# Patient Record
Sex: Female | Born: 2000 | Race: White | Hispanic: No | Marital: Single | Smoking: Never smoker
Health system: Southern US, Community
[De-identification: ages and names within clinical notes are randomized; demographics above are authoritative.]

## PROBLEM LIST (undated history)

## (undated) DIAGNOSIS — F909 Attention-deficit hyperactivity disorder, unspecified type: Secondary | ICD-10-CM

## (undated) DIAGNOSIS — F319 Bipolar disorder, unspecified: Secondary | ICD-10-CM

## (undated) DIAGNOSIS — F329 Major depressive disorder, single episode, unspecified: Secondary | ICD-10-CM

## (undated) DIAGNOSIS — J45909 Unspecified asthma, uncomplicated: Secondary | ICD-10-CM

## (undated) DIAGNOSIS — F419 Anxiety disorder, unspecified: Secondary | ICD-10-CM

## (undated) DIAGNOSIS — F938 Other childhood emotional disorders: Secondary | ICD-10-CM

## (undated) DIAGNOSIS — G4700 Insomnia, unspecified: Secondary | ICD-10-CM

## (undated) DIAGNOSIS — F32A Depression, unspecified: Secondary | ICD-10-CM

## (undated) HISTORY — DX: Attention-deficit hyperactivity disorder, unspecified type: F90.9

---
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
## 2021-01-15 ENCOUNTER — Telehealth (INDEPENDENT_AMBULATORY_CARE_PROVIDER_SITE_OTHER): Payer: 59 | Admitting: Nurse Practitioner

## 2021-01-15 ENCOUNTER — Encounter: Payer: Self-pay | Admitting: Nurse Practitioner

## 2021-01-15 DIAGNOSIS — J0110 Acute frontal sinusitis, unspecified: Secondary | ICD-10-CM

## 2021-01-15 MED ORDER — AMOXICILLIN 500 MG PO CAPS: 500.0000 mg | ORAL_CAPSULE | Freq: Two times a day (BID) | ORAL | 0 refills | Status: AC

## 2021-01-15 MED ORDER — ALBUTEROL SULFATE HFA 108 (90 BASE) MCG/ACT IN AERS: 1.0000 | INHALATION_SPRAY | Freq: Four times a day (QID) | RESPIRATORY_TRACT | 11 refills | Status: DC | PRN

## 2021-01-15 NOTE — Progress Notes (Signed)
There were no vitals taken for this visit.   Subjective:    Patient ID: Heidi Greene, female    DOB: 09-05-2000, 20 y.o.   MRN: [id number]  HPI: Heidi Greene is a 20 y.o. female  Chief Complaint  Patient presents with  . Sore Throat  . Cough  . Ear Pain    B/L    UPPER RESPIRATORY TRACT INFECTION Worst symptom: sore throat, cough, ear pain Fever: no Cough: yes Shortness of breath: yes Wheezing: no Chest pain: no Chest tightness: no Chest congestion: yes Nasal congestion: yes Runny nose: yes Post nasal drip: yes Sneezing: yes Sore throat: yes Swollen glands: no Sinus pressure: yes Headache: yes Face pain: a little uncomfortable Toothache: no Ear pain: no bilateral Ear pressure: yes bilateral Eyes red/itching:no Eye drainage/crusting: no  Vomiting: no Rash: no Fatigue: yes Sick contacts: yes Strep contacts: no  Context: worse Recurrent sinusitis: no Relief with OTC cold/cough medications: no  Treatments attempted: dayquil/nightquil   Relevant past medical, surgical, family and social history reviewed and updated as indicated. Interim medical history since our last visit reviewed. Allergies and medications reviewed and updated.  Review of Systems  Constitutional: Positive for fatigue. Negative for fever.  HENT: Positive for congestion, ear pain, postnasal drip, rhinorrhea, sinus pressure, sinus pain, sneezing and sore throat. Negative for dental problem.   Respiratory: Positive for cough and shortness of breath. Negative for wheezing.   Cardiovascular: Negative for chest pain.  Gastrointestinal: Negative for vomiting.  Skin: Negative for rash.  Neurological: Negative for headaches.    Per HPI unless specifically indicated above     Objective:    There were no vitals taken for this visit.  Wt Readings from Last 3 Encounters:  10/14/19 145 lb (65.8 kg) (80 %, Z= 0.86)*  09/01/19 150 lb (68 kg) (85 %, Z= 1.02)*  07/22/19 149 lb (67.6 kg)  (84 %, Z= 1.00)*   * Growth percentiles are based on CDC (Girls, 2-20 Years) data.    Physical Exam Vitals and nursing note reviewed.  Pulmonary:     Effort: Pulmonary effort is normal. No respiratory distress.  Neurological:     Mental Status: She is alert.  Psychiatric:        Mood and Affect: Mood normal.        Behavior: Behavior normal.        Thought Content: Thought content normal.        Judgment: Judgment normal.     Results for orders placed or performed in visit on 09/21/18  WET PREP FOR TRICH, YEAST, CLUE   Specimen: Vaginal Fluid   VAGINAL FLUI  Result Value Ref Range   Trichomonas Exam Negative Negative   Yeast Exam Negative Negative   Clue Cell Exam Negative Negative      Assessment & Plan:   Problem List Items Addressed This Visit   None   Visit Diagnoses    Acute non-recurrent frontal sinusitis    -  Primary   Complete course of antibiotics. Use inhaler PRN for SOB. RTC if symptoms worsen or do not improve.    Relevant Medications   amoxicillin (AMOXIL) 500 MG capsule       Follow up plan: No follow-ups on file.    This visit was completed via MyChart due to the restrictions of the COVID-19 pandemic. All issues as above were discussed and addressed. Physical exam was done as above through visual confirmation on MyChart. If it was felt that the  patient should be evaluated in the office, they were directed there. The patient verbally consented to this visit. 1. Location of the patient: Home 2. Location of the provider: Office 3. Those involved with this call:  ? Provider: Larae Grooms, NP ? CMA: Ulyess Mort, CMA ? Front Desk/Registration: Harriet Pho 4. Time spent on call: 15 minutes with patient via phone conference. More than 50% of this time was spent in counseling and coordination of care. 20 minutes total spent in review of patient's record and preparation of their chart.

## 2021-03-19 ENCOUNTER — Telehealth: Payer: Self-pay

## 2021-03-19 NOTE — Telephone Encounter (Signed)
Copied from CRM [id number]. Topic: General - Other >> Mar 19, 2021 10:59 AM Gwenlyn Fudge wrote: Reason for CRM: Pt called stating that she is needing to check to see that her immunizations are up to date and also states that she will need a copy for school. Please advise.

## 2021-03-19 NOTE — Telephone Encounter (Signed)
Advised patient vaccine record is ready for pick up and vaccines are up to date. Patient will be picking up record today.

## 2022-08-01 ENCOUNTER — Ambulatory Visit
Admission: EM | Admit: 2022-08-01 | Discharge: 2022-08-01 | Disposition: A | Discharge status: Home / Self Care | Payer: Managed Care, Other (non HMO) | Attending: Urgent Care | Admitting: Urgent Care

## 2022-08-01 DIAGNOSIS — Z1152 Encounter for screening for COVID-19: Secondary | ICD-10-CM | POA: Diagnosis not present

## 2022-08-01 DIAGNOSIS — J029 Acute pharyngitis, unspecified: Secondary | ICD-10-CM | POA: Diagnosis present

## 2022-08-01 DIAGNOSIS — H938X9 Other specified disorders of ear, unspecified ear: Secondary | ICD-10-CM | POA: Diagnosis present

## 2022-08-01 DIAGNOSIS — R6889 Other general symptoms and signs: Secondary | ICD-10-CM

## 2022-08-01 DIAGNOSIS — R0602 Shortness of breath: Secondary | ICD-10-CM | POA: Diagnosis present

## 2022-08-01 DIAGNOSIS — J45901 Unspecified asthma with (acute) exacerbation: Secondary | ICD-10-CM | POA: Diagnosis not present

## 2022-08-01 DIAGNOSIS — J069 Acute upper respiratory infection, unspecified: Secondary | ICD-10-CM | POA: Diagnosis not present

## 2022-08-01 LAB — RESP PANEL BY RT-PCR (FLU A&B, COVID) ARPGX2
Influenza A by PCR: NEGATIVE
Influenza B by PCR: NEGATIVE
SARS Coronavirus 2 by RT PCR: NEGATIVE

## 2022-08-01 MED ORDER — PREDNISONE 20 MG PO TABS: 60.0000 mg | ORAL_TABLET | Freq: Every day | ORAL | 0 refills | Status: AC

## 2022-08-01 MED ORDER — ALBUTEROL SULFATE HFA 108 (90 BASE) MCG/ACT IN AERS: 1.0000 | INHALATION_SPRAY | Freq: Four times a day (QID) | RESPIRATORY_TRACT | 11 refills | Status: AC | PRN

## 2022-08-01 MED ORDER — DEXAMETHASONE SODIUM PHOSPHATE 10 MG/ML IJ SOLN
10.0000 mg | Freq: Once | INTRAMUSCULAR | Status: AC
  Administered 2022-08-01: 10 mg via INTRAMUSCULAR

## 2022-08-01 NOTE — Discharge Instructions (Addendum)
You are being treated today for an acute exacerbation of your asthma.  We have given you an injection of a steroid in clinic charging you with an oral steroid which she will take for the next 5 days.  Please take the oral steroid in the morning with food to avoid both sleep disturbance and irritation of your stomach.  Follow up here or with your primary care provider if your symptoms are worsening or not improving with treatment.

## 2022-08-01 NOTE — ED Triage Notes (Signed)
Pt. Presents to UC w/ c/o a sore throat and ear pressure that started 3 days ago. Pt. Also endorses SOB that started last night.

## 2022-08-01 NOTE — ED Provider Notes (Addendum)
Renaldo Fiddler    CSN: [id number] Arrival date & time: 08/01/22  1143      History   Chief Complaint Chief Complaint  Patient presents with   Sore Throat   Ear Fullness   Shortness of Breath    HPI Heidi Greene is a 21 y.o. female.    Sore Throat Associated symptoms include shortness of breath.  Ear Fullness Associated symptoms include shortness of breath.  Shortness of Breath   Urgent care with complaint of sore throat and ear pressure starting 3 days ago.  Patient also endorses shortness of breath starting last night.  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 Authorizing Provider  ADDERALL XR 10 MG 24 hr capsule Take 1 capsule by mouth every morning. Patient not  taking: Reported on 01/15/2021 04/08/19   [provider]  albuterol (PROAIR HFA) 108 (90 Base) MCG/ACT inhaler Inhale 1 puff into the lungs every 6 (six) hours as needed for wheezing or shortness of breath. 01/15/21   Larae Grooms, NP  FLOVENT HFA 44 MCG/ACT inhaler INL 2 PFS PO BID 04/09/18   Particia Nearing, PA-C  fluticasone [facility]) 50 MCG/ACT nasal spray Place 1 spray into both nostrils 2 (two) times daily. Patient not taking: Reported on 01/15/2021 05/06/18   Particia Nearing, PA-C  lamoTRIgine (LAMICTAL) 150 MG tablet  02/23/18   [provider]    Family History Family History  Problem Relation Age of Onset   Diabetes Maternal Grandfather    Hypertension Maternal Grandfather    Bipolar disorder Maternal Grandfather    Alcohol abuse Maternal Grandfather    Pancreatic cancer Maternal Grandfather 65       Deceased at 77    Social History Social History   Tobacco Use   Smoking status: Never   Smokeless tobacco: Never  Vaping Use   Vaping Use: Never used  Substance Use Topics   Alcohol use: No   Drug use:  No     Allergies   Patient has no known allergies.   Review of Systems Review of Systems  Respiratory:  Positive for shortness of breath.      Physical Exam Triage Vital Signs ED Triage Vitals  Enc Vitals Group     BP 08/01/22 1253 112/61     Pulse Rate 08/01/22 1253 (!) 120     Resp 08/01/22 1253 18     Temp 08/01/22 1253 99.9 F (37.7 C)     Temp src --      SpO2 08/01/22 1253 94 %     Weight --      Height --      Head Circumference --      Peak Flow --      Pain Score 08/01/22 1252 0     Pain Loc --      Pain Edu? --      Excl. in GC? --    No data found.  Updated Vital Signs BP 112/61   Pulse (!) 120   Temp 99.9 F (37.7 C)   Resp 18   SpO2 94%   Visual Acuity Right Eye Distance:   Left Eye Distance:   Bilateral Distance:    Right Eye Near:   Left Eye Near:    Bilateral Near:     Physical  Exam Vitals reviewed.  Constitutional:      Appearance: She is well-developed.  Neurological:     General: No focal deficit present.     Mental Status: She is alert and oriented to person, place, and time.  Psychiatric:        Mood and Affect: Mood normal.        Behavior: Behavior normal.      UC Treatments / Results  Labs (all labs ordered are listed, but only abnormal results are displayed) Labs Reviewed  RESP PANEL BY RT-PCR (FLU A&B, COVID) [id number]    EKG   Radiology No results found.  Procedures Procedures (including critical care time)  Medications Ordered in UC Medications - No data to display  Initial Impression / Assessment and Plan / UC Course  I have reviewed the triage vital signs and the nursing notes.  Pertinent labs & imaging results that were available during my care of the patient were reviewed by me and considered in my medical decision making (see chart for details).   Patient is afebrile here without recent antipyretics. Satting well on room air. Overall is ill appearing, well hydrated and without respiratory distress. Pulmonary exam is remarkable for wheezing in all lung lobes.   Viral URI which has triggered an acute exacerbation of asthma.  Administering albuterol via neb in clinic and symptoms improved significantly. Treating also with Decadron 10 mg in clinic and discharging with a course of prednisone at home.  Providing renewal of her albuterol inhaler.   Final Clinical Impressions(s) / UC Diagnoses   Final diagnoses:  Flu-like symptoms   Discharge Instructions   None    ED Prescriptions   None    PDMP not reviewed this encounter.   Charma Igo, FNP 08/01/22 1315    ImmordinoJeannett Senior, FNP 08/01/22 1343
